# Patient Record
Sex: Female | Born: 1960 | Race: White | Hispanic: No | Marital: Single
Health system: Southern US, Community
[De-identification: ages and names within clinical notes are randomized; demographics above are authoritative.]

---

## 2007-06-07 ENCOUNTER — Emergency Department (HOSPITAL_COMMUNITY): Admission: EM | Admit: 2007-06-07 | Discharge: 2007-06-07 | Payer: Self-pay | Admitting: Emergency Medicine

## 2008-03-16 ENCOUNTER — Other Ambulatory Visit: Admission: RE | Admit: 2008-03-16 | Discharge: 2008-03-16 | Payer: Self-pay | Admitting: Family Medicine

## 2008-03-27 ENCOUNTER — Encounter: Admission: RE | Admit: 2008-03-27 | Discharge: 2008-03-27 | Payer: Self-pay | Admitting: Family Medicine

## 2009-03-22 ENCOUNTER — Encounter: Admission: RE | Admit: 2009-03-22 | Discharge: 2009-03-22 | Payer: Self-pay | Admitting: Family Medicine

## 2009-03-22 IMAGING — MG MM DIGITAL SCREENING
6 series · 6 of 6 positions shown · non-contrast
Comparison: none

DG SCREEN MAMMOGRAM BILATERAL
Bilateral CC and MLO view(s) were taken.
Technologist: MORYS(MORYS)

DIGITAL SCREENING MAMMOGRAM WITH CAD:
The breast tissue is heterogeneously dense.  No masses or malignant type calcifications are 
identified.  Compared with prior studies.
Images were processed with CAD.

[R CC]
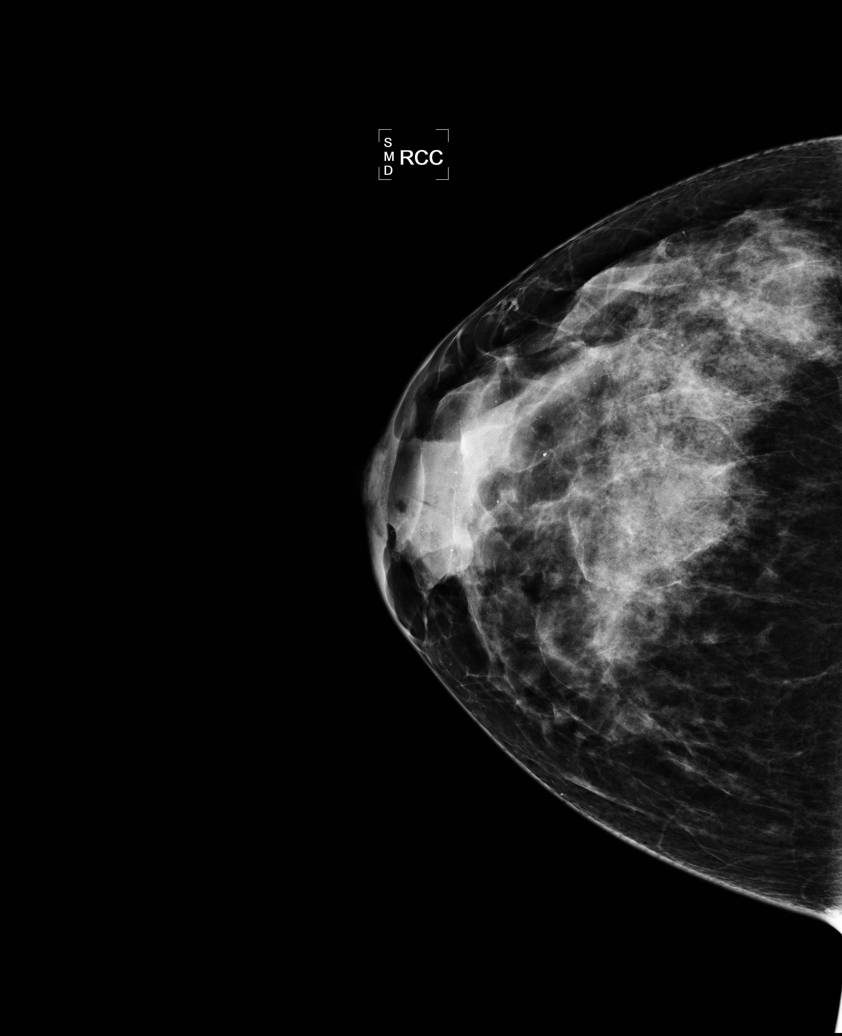

[L CC]
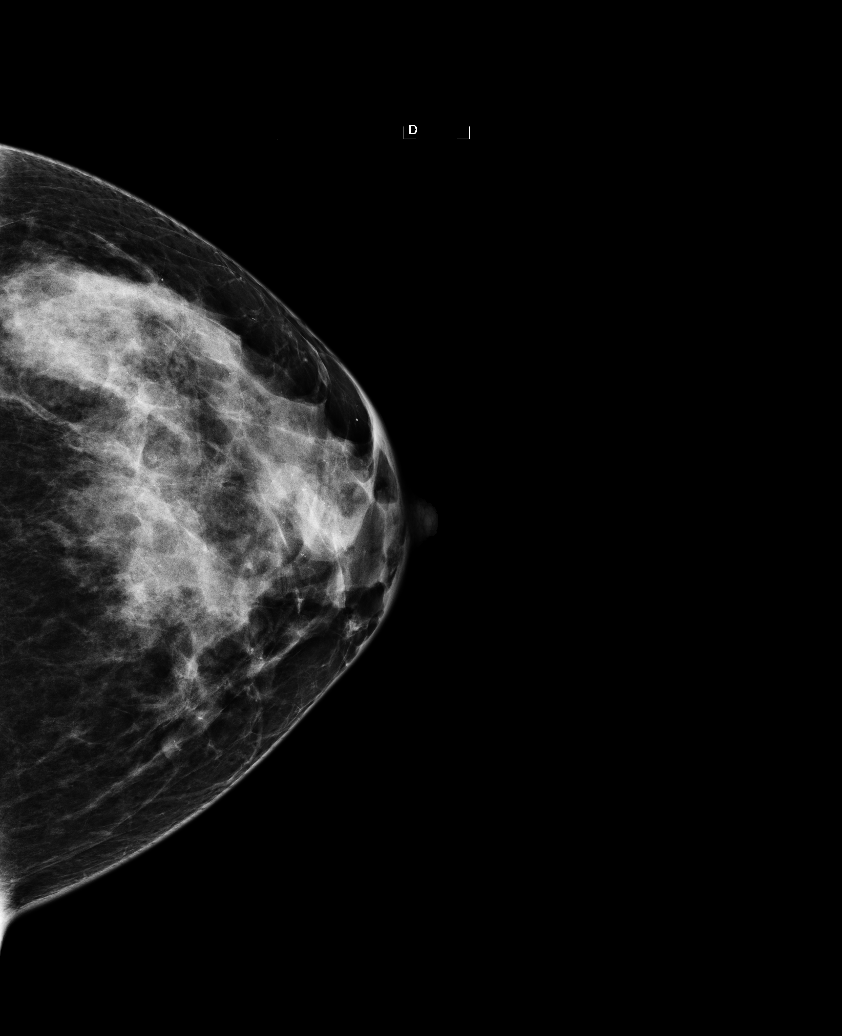

[L MLO (1 of 2)]
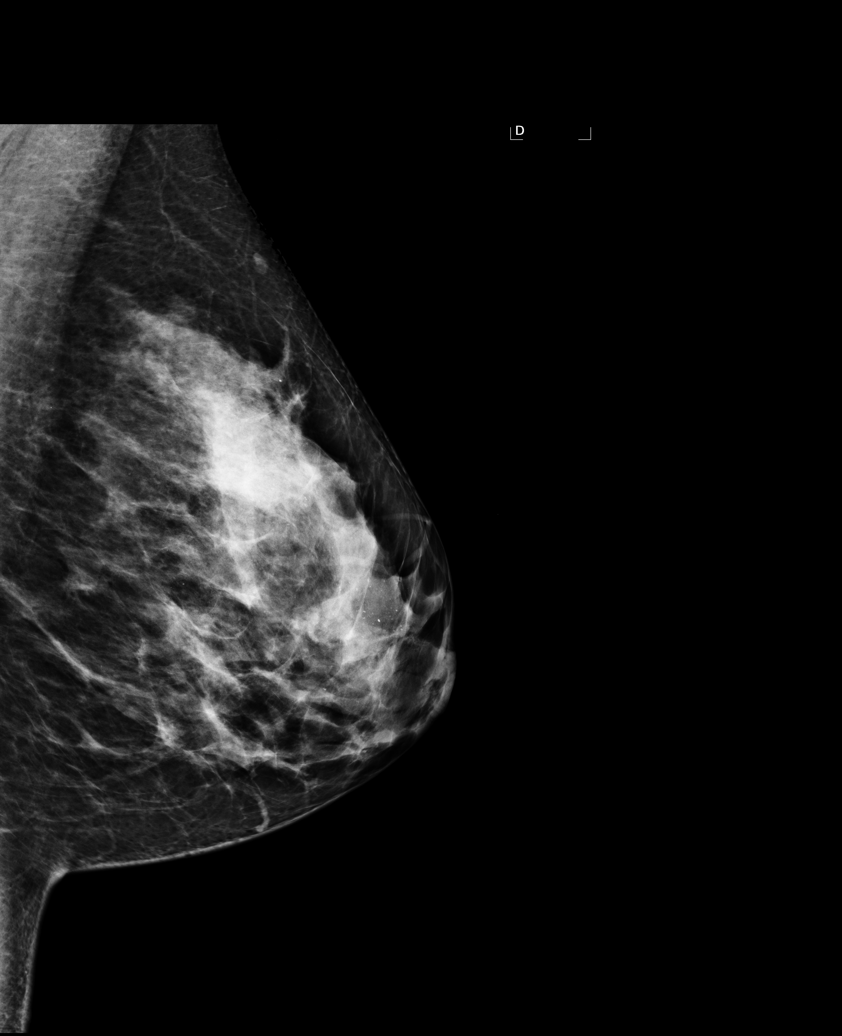

[R MLO (1 of 2)]
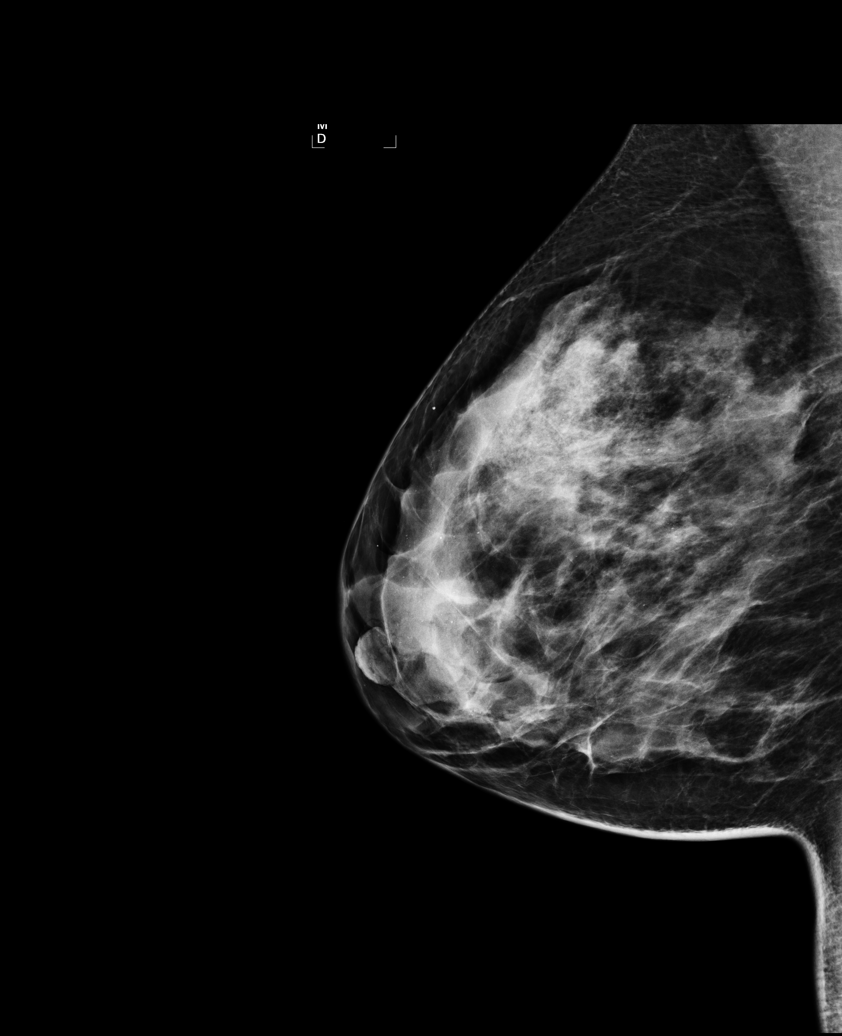

[R MLO (2 of 2)]
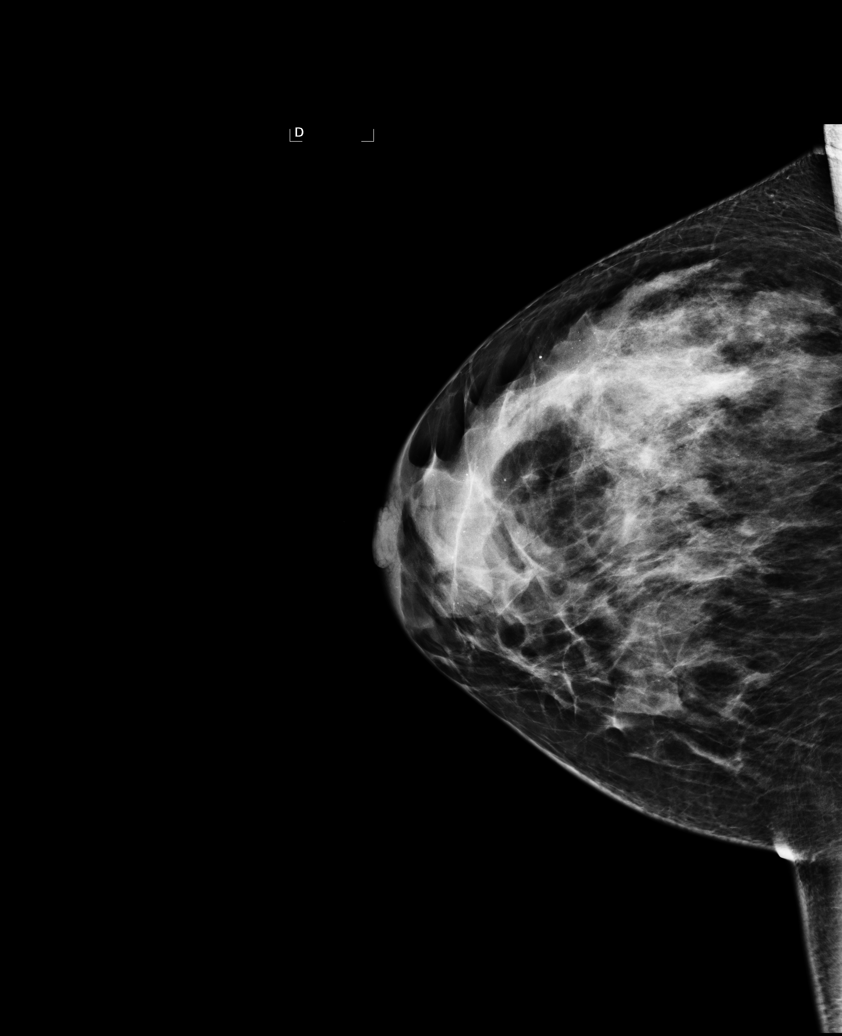

[L MLO (2 of 2)]
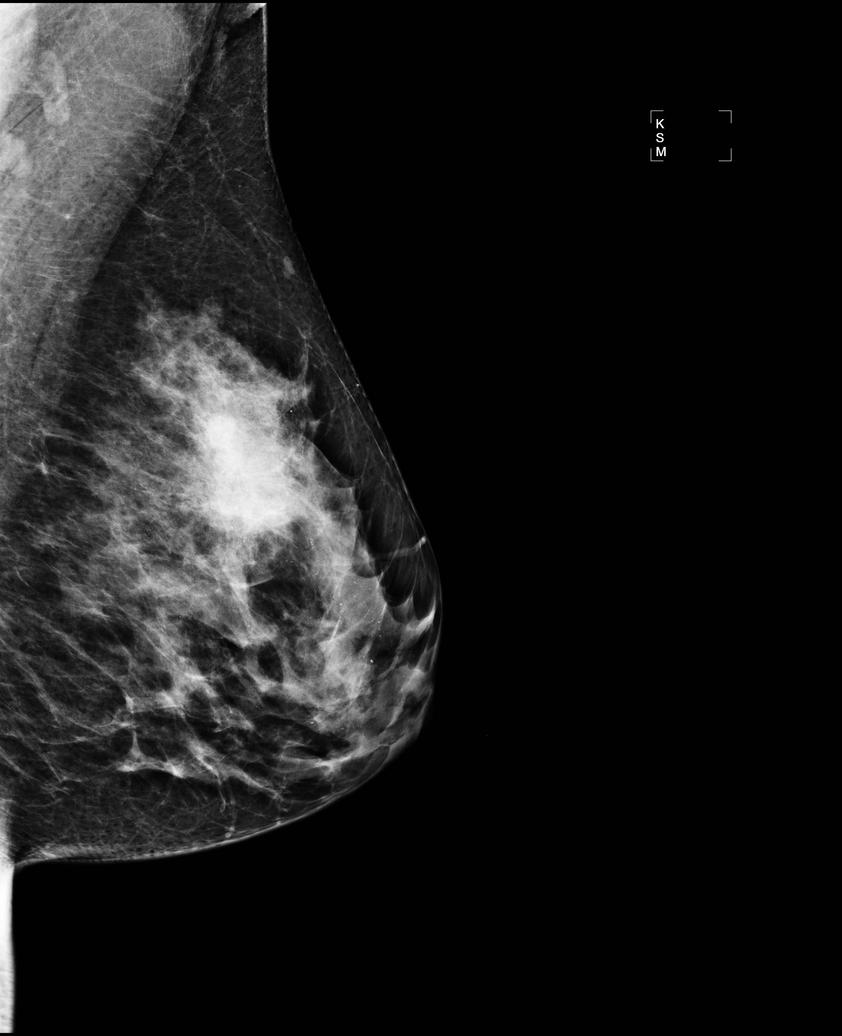

[6 of 6 positions shown; findings below may reference images not displayed]

IMPRESSION: No specific mammographic evidence of malignancy.  Next screening mammogram is recommended in one 
year.

A result letter of this screening mammogram will be mailed directly to the patient.

ASSESSMENT: Negative - BI-RADS 1

Screening mammogram in 1 year.
,

## 2009-03-27 ENCOUNTER — Other Ambulatory Visit: Admission: RE | Admit: 2009-03-27 | Discharge: 2009-03-27 | Payer: Self-pay | Admitting: Obstetrics and Gynecology

## 2010-02-22 ENCOUNTER — Other Ambulatory Visit: Admission: RE | Admit: 2010-02-22 | Discharge: 2010-02-22 | Payer: Self-pay | Admitting: Obstetrics and Gynecology

## 2010-02-22 ENCOUNTER — Encounter: Admission: RE | Admit: 2010-02-22 | Discharge: 2010-02-22 | Payer: Self-pay | Admitting: Family Medicine

## 2011-01-06 LAB — CBC
HCT: 39.3
Hemoglobin: 13.7
Platelets: 345
WBC: 10.9 — ABNORMAL HIGH

## 2011-01-06 LAB — URINALYSIS, ROUTINE W REFLEX MICROSCOPIC
Nitrite: NEGATIVE
Specific Gravity, Urine: 1.021
Urobilinogen, UA: 0.2

## 2011-01-06 LAB — BASIC METABOLIC PANEL
Calcium: 9
GFR calc non Af Amer: >60
Potassium: 3.9
Sodium: 137

## 2011-01-06 LAB — DIFFERENTIAL
Eosinophils Relative: 1
Lymphocytes Relative: 14
Lymphs Abs: 1.5
Monocytes Absolute: 0.4
Neutro Abs: 9 — ABNORMAL HIGH

## 2011-01-06 LAB — URINE MICROSCOPIC-ADD ON

## 2011-01-06 LAB — URINE CULTURE

## 2011-01-06 LAB — PREGNANCY, URINE: Preg Test, Ur: NEGATIVE

## 2011-01-27 ENCOUNTER — Other Ambulatory Visit: Payer: Self-pay | Admitting: Family Medicine

## 2011-01-27 DIAGNOSIS — Z1231 Encounter for screening mammogram for malignant neoplasm of breast: Secondary | ICD-10-CM

## 2011-02-24 ENCOUNTER — Other Ambulatory Visit (HOSPITAL_COMMUNITY)
Admission: RE | Admit: 2011-02-24 | Discharge: 2011-02-24 | Disposition: A | Discharge status: Home / Self Care | Payer: BC Managed Care – PPO | Source: Ambulatory Visit | Attending: Obstetrics and Gynecology | Admitting: Obstetrics and Gynecology

## 2011-02-24 ENCOUNTER — Other Ambulatory Visit: Payer: Self-pay | Admitting: Obstetrics and Gynecology

## 2011-02-24 ENCOUNTER — Ambulatory Visit
Admission: RE | Admit: 2011-02-24 | Discharge: 2011-02-24 | Disposition: A | Discharge status: Home / Self Care | Payer: BC Managed Care – PPO | Source: Ambulatory Visit | Attending: Family Medicine | Admitting: Family Medicine

## 2011-02-24 DIAGNOSIS — Z01419 Encounter for gynecological examination (general) (routine) without abnormal findings: Secondary | ICD-10-CM | POA: Insufficient documentation

## 2011-02-24 DIAGNOSIS — Z1231 Encounter for screening mammogram for malignant neoplasm of breast: Secondary | ICD-10-CM

## 2012-02-24 ENCOUNTER — Other Ambulatory Visit (HOSPITAL_COMMUNITY)
Admission: RE | Admit: 2012-02-24 | Discharge: 2012-02-24 | Disposition: A | Discharge status: Home / Self Care | Payer: BC Managed Care – PPO | Source: Ambulatory Visit | Attending: Obstetrics and Gynecology | Admitting: Obstetrics and Gynecology

## 2012-02-24 ENCOUNTER — Other Ambulatory Visit: Payer: Self-pay | Admitting: Obstetrics and Gynecology

## 2012-02-24 DIAGNOSIS — Z01419 Encounter for gynecological examination (general) (routine) without abnormal findings: Secondary | ICD-10-CM | POA: Insufficient documentation

## 2012-02-25 ENCOUNTER — Other Ambulatory Visit: Payer: Self-pay | Admitting: Obstetrics and Gynecology

## 2012-02-25 DIAGNOSIS — Z1231 Encounter for screening mammogram for malignant neoplasm of breast: Secondary | ICD-10-CM

## 2012-03-19 ENCOUNTER — Ambulatory Visit
Admission: RE | Admit: 2012-03-19 | Discharge: 2012-03-19 | Disposition: A | Discharge status: Home / Self Care | Payer: BC Managed Care – PPO | Source: Ambulatory Visit | Attending: Obstetrics and Gynecology | Admitting: Obstetrics and Gynecology

## 2012-03-19 DIAGNOSIS — Z1231 Encounter for screening mammogram for malignant neoplasm of breast: Secondary | ICD-10-CM

## 2013-02-17 ENCOUNTER — Other Ambulatory Visit: Payer: Self-pay

## 2013-02-17 DIAGNOSIS — Z1231 Encounter for screening mammogram for malignant neoplasm of breast: Secondary | ICD-10-CM

## 2013-02-28 ENCOUNTER — Other Ambulatory Visit (HOSPITAL_COMMUNITY)
Admission: RE | Admit: 2013-02-28 | Discharge: 2013-02-28 | Disposition: A | Discharge status: Home / Self Care | Payer: BC Managed Care – PPO | Source: Ambulatory Visit | Attending: Obstetrics and Gynecology | Admitting: Obstetrics and Gynecology

## 2013-02-28 ENCOUNTER — Other Ambulatory Visit: Payer: Self-pay | Admitting: Obstetrics and Gynecology

## 2013-02-28 DIAGNOSIS — Z1151 Encounter for screening for human papillomavirus (HPV): Secondary | ICD-10-CM | POA: Insufficient documentation

## 2013-02-28 DIAGNOSIS — Z01419 Encounter for gynecological examination (general) (routine) without abnormal findings: Secondary | ICD-10-CM | POA: Insufficient documentation

## 2013-03-21 ENCOUNTER — Ambulatory Visit
Admission: RE | Admit: 2013-03-21 | Discharge: 2013-03-21 | Disposition: A | Discharge status: Home / Self Care | Payer: BC Managed Care – PPO | Source: Ambulatory Visit

## 2013-03-21 DIAGNOSIS — Z1231 Encounter for screening mammogram for malignant neoplasm of breast: Secondary | ICD-10-CM

## 2014-03-01 ENCOUNTER — Other Ambulatory Visit (HOSPITAL_COMMUNITY)
Admission: RE | Admit: 2014-03-01 | Discharge: 2014-03-01 | Disposition: A | Discharge status: Home / Self Care | Payer: BC Managed Care – PPO | Source: Ambulatory Visit | Attending: Obstetrics and Gynecology | Admitting: Obstetrics and Gynecology

## 2014-03-01 DIAGNOSIS — Z01419 Encounter for gynecological examination (general) (routine) without abnormal findings: Secondary | ICD-10-CM | POA: Diagnosis not present

## 2014-03-02 ENCOUNTER — Other Ambulatory Visit: Payer: Self-pay | Admitting: Obstetrics and Gynecology

## 2014-03-06 LAB — CYTOLOGY - PAP

## 2015-03-06 ENCOUNTER — Other Ambulatory Visit: Payer: Self-pay | Admitting: Obstetrics and Gynecology

## 2015-03-06 ENCOUNTER — Other Ambulatory Visit (HOSPITAL_COMMUNITY)
Admission: RE | Admit: 2015-03-06 | Discharge: 2015-03-06 | Disposition: A | Discharge status: Home / Self Care | Payer: BLUE CROSS/BLUE SHIELD | Source: Ambulatory Visit | Attending: Obstetrics and Gynecology | Admitting: Obstetrics and Gynecology

## 2015-03-06 DIAGNOSIS — Z01419 Encounter for gynecological examination (general) (routine) without abnormal findings: Secondary | ICD-10-CM | POA: Diagnosis not present

## 2015-03-09 LAB — CYTOLOGY - PAP

## 2015-03-27 ENCOUNTER — Other Ambulatory Visit: Payer: Self-pay

## 2015-03-27 DIAGNOSIS — Z1231 Encounter for screening mammogram for malignant neoplasm of breast: Secondary | ICD-10-CM

## 2015-04-17 ENCOUNTER — Ambulatory Visit
Admission: RE | Admit: 2015-04-17 | Discharge: 2015-04-17 | Disposition: A | Discharge status: Home / Self Care | Payer: BLUE CROSS/BLUE SHIELD | Source: Ambulatory Visit

## 2015-04-17 DIAGNOSIS — Z1231 Encounter for screening mammogram for malignant neoplasm of breast: Secondary | ICD-10-CM

## 2016-03-11 ENCOUNTER — Other Ambulatory Visit (HOSPITAL_COMMUNITY)
Admission: RE | Admit: 2016-03-11 | Discharge: 2016-03-11 | Disposition: A | Discharge status: Home / Self Care | Payer: BLUE CROSS/BLUE SHIELD | Source: Ambulatory Visit | Attending: Obstetrics and Gynecology | Admitting: Obstetrics and Gynecology

## 2016-03-11 ENCOUNTER — Other Ambulatory Visit: Payer: Self-pay | Admitting: Obstetrics and Gynecology

## 2016-03-11 DIAGNOSIS — Z01419 Encounter for gynecological examination (general) (routine) without abnormal findings: Secondary | ICD-10-CM | POA: Insufficient documentation

## 2016-03-11 DIAGNOSIS — Z1151 Encounter for screening for human papillomavirus (HPV): Secondary | ICD-10-CM | POA: Diagnosis not present

## 2016-03-14 LAB — CYTOLOGY - PAP
DIAGNOSIS: NEGATIVE
HPV (WINDOPATH): NOT DETECTED

## 2017-03-12 ENCOUNTER — Other Ambulatory Visit: Payer: Self-pay | Admitting: Family Medicine

## 2017-03-12 DIAGNOSIS — Z1231 Encounter for screening mammogram for malignant neoplasm of breast: Secondary | ICD-10-CM

## 2017-04-10 ENCOUNTER — Ambulatory Visit
Admission: RE | Admit: 2017-04-10 | Discharge: 2017-04-10 | Disposition: A | Discharge status: Home / Self Care | Payer: BLUE CROSS/BLUE SHIELD | Source: Ambulatory Visit | Attending: Family Medicine | Admitting: Family Medicine

## 2017-04-10 DIAGNOSIS — Z1231 Encounter for screening mammogram for malignant neoplasm of breast: Secondary | ICD-10-CM

## 2018-05-18 ENCOUNTER — Other Ambulatory Visit: Payer: Self-pay | Admitting: Family Medicine

## 2018-05-18 DIAGNOSIS — Z1231 Encounter for screening mammogram for malignant neoplasm of breast: Secondary | ICD-10-CM

## 2018-06-15 ENCOUNTER — Ambulatory Visit
Admission: RE | Admit: 2018-06-15 | Discharge: 2018-06-15 | Disposition: A | Discharge status: Home / Self Care | Payer: BLUE CROSS/BLUE SHIELD | Source: Ambulatory Visit | Attending: Family Medicine | Admitting: Family Medicine

## 2018-06-15 DIAGNOSIS — Z1231 Encounter for screening mammogram for malignant neoplasm of breast: Secondary | ICD-10-CM

## 2019-03-21 ENCOUNTER — Other Ambulatory Visit: Payer: Self-pay | Admitting: Obstetrics and Gynecology

## 2019-03-21 ENCOUNTER — Other Ambulatory Visit (HOSPITAL_COMMUNITY)
Admission: RE | Admit: 2019-03-21 | Discharge: 2019-03-21 | Disposition: A | Discharge status: Home / Self Care | Payer: BC Managed Care – PPO | Source: Ambulatory Visit | Attending: Obstetrics and Gynecology | Admitting: Obstetrics and Gynecology

## 2019-03-21 DIAGNOSIS — Z01419 Encounter for gynecological examination (general) (routine) without abnormal findings: Secondary | ICD-10-CM | POA: Insufficient documentation

## 2019-03-23 LAB — CYTOLOGY - PAP
Diagnosis: NEGATIVE
High risk HPV: NEGATIVE

## 2019-07-16 ENCOUNTER — Ambulatory Visit: Payer: BC Managed Care – PPO | Attending: Internal Medicine

## 2019-07-16 DIAGNOSIS — Z23 Encounter for immunization: Secondary | ICD-10-CM

## 2019-07-16 NOTE — Progress Notes (Signed)
   Covid-19 Vaccination Clinic  Name:  Alynah Schone    MRN: [id number] DOB: 1960-08-12  07/16/2019  Ms. Tague was observed post Covid-19 immunization for 15 minutes without incident. She was provided with Vaccine Information Sheet and instruction to access the V-Safe system.   Ms. Ferriss was instructed to call 911 with any severe reactions post vaccine: Marland Kitchen Difficulty breathing  . Swelling of face and throat  . A fast heartbeat  . A bad rash all over body  . Dizziness and weakness   Immunizations Administered    Name Date Dose VIS Date Route   Pfizer COVID-19 Vaccine 07/16/2019  9:14 AM 0.3 mL 03/25/2019 Intramuscular   Manufacturer: [employer], [employer]   Lot: [id number]   NDC: [address]-0

## 2019-08-09 ENCOUNTER — Ambulatory Visit: Payer: BC Managed Care – PPO | Attending: Internal Medicine

## 2019-08-09 DIAGNOSIS — Z23 Encounter for immunization: Secondary | ICD-10-CM

## 2019-08-09 NOTE — Progress Notes (Signed)
   Covid-19 Vaccination Clinic  Name:  Heather Mcguire    MRN: [id number] DOB: Dec 29, 1960  08/09/2019  [name] was observed post Covid-19 immunization for 15 minutes without incident. She was provided with Vaccine Information Sheet and instruction to access the V-Safe system.   [name] was instructed to call 911 with any severe reactions post vaccine: Marland Kitchen Difficulty breathing  . Swelling of face and throat  . A fast heartbeat  . A bad rash all over body  . Dizziness and weakness   Immunizations Administered    Name Date Dose VIS Date Route   Pfizer COVID-19 Vaccine 08/09/2019  2:51 PM 0.3 mL 06/08/2018 Intramuscular   Manufacturer: [employer], [employer]   Lot: [id number]   NDC: [address]-2

## 2020-03-16 ENCOUNTER — Ambulatory Visit: Payer: BC Managed Care – PPO | Attending: Internal Medicine

## 2020-03-16 DIAGNOSIS — Z23 Encounter for immunization: Secondary | ICD-10-CM

## 2020-03-16 NOTE — Progress Notes (Signed)
   Covid-19 Vaccination Clinic  Name:  Heather Mcguire    MRN: [id number] DOB: 1961/03/07  03/16/2020  [name] was observed post Covid-19 immunization for 15 minutes without incident. She was provided with Vaccine Information Sheet and instruction to access the V-Safe system.   [name] was instructed to call 911 with any severe reactions post vaccine: Marland Kitchen Difficulty breathing  . Swelling of face and throat  . A fast heartbeat  . A bad rash all over body  . Dizziness and weakness   Immunizations Administered    Name Date Dose VIS Date Route   Pfizer COVID-19 Vaccine 03/16/2020  5:19 PM 0.3 mL 02/01/2020 Intramuscular   Manufacturer: [employer], [employer]   Lot: [id number]   NDC: [address]-5

## 2020-08-08 ENCOUNTER — Other Ambulatory Visit: Payer: Self-pay | Admitting: Family Medicine

## 2020-08-08 DIAGNOSIS — Z1231 Encounter for screening mammogram for malignant neoplasm of breast: Secondary | ICD-10-CM

## 2020-09-27 ENCOUNTER — Other Ambulatory Visit: Payer: Self-pay

## 2020-09-27 ENCOUNTER — Ambulatory Visit
Admission: RE | Admit: 2020-09-27 | Discharge: 2020-09-27 | Disposition: A | Discharge status: Home / Self Care | Payer: BC Managed Care – PPO | Source: Ambulatory Visit | Attending: Family Medicine | Admitting: Family Medicine

## 2020-09-27 DIAGNOSIS — Z1231 Encounter for screening mammogram for malignant neoplasm of breast: Secondary | ICD-10-CM

## 2021-09-17 LAB — EXTERNAL GENERIC LAB PROCEDURE: COLOGUARD: NEGATIVE

## 2021-10-01 ENCOUNTER — Other Ambulatory Visit: Payer: Self-pay | Admitting: Family Medicine

## 2021-10-01 DIAGNOSIS — Z1231 Encounter for screening mammogram for malignant neoplasm of breast: Secondary | ICD-10-CM

## 2021-10-03 ENCOUNTER — Ambulatory Visit
Admission: RE | Admit: 2021-10-03 | Discharge: 2021-10-03 | Disposition: A | Discharge status: Home / Self Care | Payer: BC Managed Care – PPO | Source: Ambulatory Visit | Attending: Family Medicine | Admitting: Family Medicine

## 2021-10-03 DIAGNOSIS — Z1231 Encounter for screening mammogram for malignant neoplasm of breast: Secondary | ICD-10-CM

## 2023-02-16 ENCOUNTER — Other Ambulatory Visit: Payer: Self-pay | Admitting: Family Medicine

## 2023-02-16 DIAGNOSIS — Z1231 Encounter for screening mammogram for malignant neoplasm of breast: Secondary | ICD-10-CM

## 2023-02-18 ENCOUNTER — Inpatient Hospital Stay
Admission: RE | Admit: 2023-02-18 | Discharge: 2023-02-18 | Discharge status: Home / Self Care | Payer: BC Managed Care – PPO | Source: Ambulatory Visit | Attending: Family Medicine | Admitting: Family Medicine

## 2023-02-18 DIAGNOSIS — Z1231 Encounter for screening mammogram for malignant neoplasm of breast: Secondary | ICD-10-CM
